# Patient Record
Sex: Female | Born: 1970 | Race: White | Hispanic: No | Marital: Single | Smoking: Former smoker
Health system: Southern US, Community
[De-identification: ages and names within clinical notes are randomized; demographics above are authoritative.]

## PROBLEM LIST (undated history)

## (undated) DIAGNOSIS — I10 Essential (primary) hypertension: Secondary | ICD-10-CM

## (undated) DIAGNOSIS — J45909 Unspecified asthma, uncomplicated: Secondary | ICD-10-CM

## (undated) HISTORY — PX: TUBAL LIGATION: SHX77

## (undated) HISTORY — PX: HERNIA REPAIR: SHX51

---

## 1999-06-04 ENCOUNTER — Emergency Department (HOSPITAL_COMMUNITY): Admission: EM | Admit: 1999-06-04 | Discharge: 1999-06-04 | Payer: Self-pay | Admitting: Emergency Medicine

## 1999-07-15 ENCOUNTER — Emergency Department (HOSPITAL_COMMUNITY): Admission: EM | Admit: 1999-07-15 | Discharge: 1999-07-15 | Payer: Self-pay | Admitting: Emergency Medicine

## 1999-12-02 ENCOUNTER — Emergency Department (HOSPITAL_COMMUNITY): Admission: EM | Admit: 1999-12-02 | Discharge: 1999-12-02 | Payer: Self-pay

## 2009-10-24 ENCOUNTER — Emergency Department (HOSPITAL_BASED_OUTPATIENT_CLINIC_OR_DEPARTMENT_OTHER): Admission: EM | Admit: 2009-10-24 | Discharge: 2009-10-24 | Payer: Self-pay | Admitting: Emergency Medicine

## 2009-10-28 ENCOUNTER — Emergency Department (HOSPITAL_BASED_OUTPATIENT_CLINIC_OR_DEPARTMENT_OTHER): Admission: EM | Admit: 2009-10-28 | Discharge: 2009-10-28 | Payer: Self-pay | Admitting: Emergency Medicine

## 2010-06-09 ENCOUNTER — Emergency Department (HOSPITAL_BASED_OUTPATIENT_CLINIC_OR_DEPARTMENT_OTHER): Admission: EM | Admit: 2010-06-09 | Discharge: 2010-06-09 | Payer: Self-pay | Admitting: Emergency Medicine

## 2010-12-18 LAB — WET PREP, GENITAL

## 2010-12-18 LAB — GC/CHLAMYDIA PROBE AMP, GENITAL: Chlamydia, DNA Probe: NEGATIVE

## 2010-12-18 LAB — URINE MICROSCOPIC-ADD ON

## 2010-12-18 LAB — URINE CULTURE

## 2010-12-18 LAB — PREGNANCY, URINE: Preg Test, Ur: NEGATIVE

## 2010-12-18 LAB — URINALYSIS, ROUTINE W REFLEX MICROSCOPIC
Bilirubin Urine: NEGATIVE
Ketones, ur: NEGATIVE mg/dL
Nitrite: NEGATIVE
pH: 6 (ref 5.0–8.0)

## 2016-04-25 ENCOUNTER — Encounter (HOSPITAL_BASED_OUTPATIENT_CLINIC_OR_DEPARTMENT_OTHER): Payer: Self-pay | Admitting: *Deleted

## 2016-04-25 ENCOUNTER — Emergency Department (HOSPITAL_BASED_OUTPATIENT_CLINIC_OR_DEPARTMENT_OTHER): Payer: Worker's Compensation

## 2016-04-25 ENCOUNTER — Emergency Department (HOSPITAL_BASED_OUTPATIENT_CLINIC_OR_DEPARTMENT_OTHER)
Admission: EM | Admit: 2016-04-25 | Discharge: 2016-04-25 | Disposition: A | Discharge status: Home / Self Care | Payer: Worker's Compensation | Attending: Emergency Medicine | Admitting: Emergency Medicine

## 2016-04-25 DIAGNOSIS — S6000XA Contusion of unspecified finger without damage to nail, initial encounter: Secondary | ICD-10-CM

## 2016-04-25 DIAGNOSIS — Y9389 Activity, other specified: Secondary | ICD-10-CM | POA: Diagnosis not present

## 2016-04-25 DIAGNOSIS — Y929 Unspecified place or not applicable: Secondary | ICD-10-CM | POA: Insufficient documentation

## 2016-04-25 DIAGNOSIS — Y990 Civilian activity done for income or pay: Secondary | ICD-10-CM | POA: Diagnosis not present

## 2016-04-25 DIAGNOSIS — Z79899 Other long term (current) drug therapy: Secondary | ICD-10-CM | POA: Diagnosis not present

## 2016-04-25 DIAGNOSIS — Z87891 Personal history of nicotine dependence: Secondary | ICD-10-CM | POA: Diagnosis not present

## 2016-04-25 DIAGNOSIS — W230XXA Caught, crushed, jammed, or pinched between moving objects, initial encounter: Secondary | ICD-10-CM | POA: Insufficient documentation

## 2016-04-25 DIAGNOSIS — S60041A Contusion of right ring finger without damage to nail, initial encounter: Secondary | ICD-10-CM | POA: Insufficient documentation

## 2016-04-25 DIAGNOSIS — I10 Essential (primary) hypertension: Secondary | ICD-10-CM | POA: Diagnosis not present

## 2016-04-25 DIAGNOSIS — S61214A Laceration without foreign body of right ring finger without damage to nail, initial encounter: Secondary | ICD-10-CM | POA: Diagnosis present

## 2016-04-25 DIAGNOSIS — J45909 Unspecified asthma, uncomplicated: Secondary | ICD-10-CM | POA: Diagnosis not present

## 2016-04-25 HISTORY — DX: Unspecified asthma, uncomplicated: J45.909

## 2016-04-25 HISTORY — DX: Essential (primary) hypertension: I10

## 2016-04-25 NOTE — ED Notes (Addendum)
Per pt report ring on rt ring finger got caught in cart and pulled upwards. Bruising and swelling present to ring finger.  Work related injury, pt asked if needed a drug screen , pt reports no.

## 2016-04-25 NOTE — Discharge Instructions (Signed)
I recommend taking 600 mg ibuprofen every 6 hours for pain relief. I also recommend resting, elevating and applying ice to affected area for 15 minutes to help with pain and swelling. Please follow up with a primary care provider from the Resource Guide provided below in one week if your symptoms have not resolved. Please return to the Emergency Department if symptoms worsen or new onset of fever, redness, swelling, warmth, numbness, tingling, weakness.

## 2016-04-25 NOTE — ED Provider Notes (Signed)
CSN: [id number]     Arrival date & time 04/25/16  1058 History   First MD Initiated Contact with Patient 04/25/16 1114     Chief Complaint  Patient presents with  . Finger Injury     (Consider location/radiation/quality/duration/timing/severity/associated sxs/prior Treatment) HPI   Patient is a 45 year old female past medical history of hypertension who presents to the ED with complaint of right ring finger pain, onset a 30 this morning. Patient reports while she was at work pushing a cart, the door in front of her swung closed and hit her cart. She notes she jerked her hand up to try to prevent a container on the cart from falling but notes her ring on her right ring finger got caught on the cart rail. Patient reports taking her ring off immediately and then endorses having swelling and bruising to her right ring finger. Patient endorses pain to her right ring finger extending into her right hand. Pain worse with movement. Denies numbness, tingling, weakness. Patient endorses taking ibuprofen earlier this morning prior to injury. Patient reports she has applied ice to her finger with mild relief. Denies any other pain or complaints.  Past Medical History  Diagnosis Date  . Asthma   . Hypertension    Past Surgical History  Procedure Laterality Date  . Hernia repair    . Tubal ligation     History reviewed. No pertinent family history. Social History  Substance Use Topics  . Smoking status: Former Smoker    Quit date: 04/06/2012  . Smokeless tobacco: None  . Alcohol Use: Yes     Comment: occ   OB History    No data available     Review of Systems  Musculoskeletal: Positive for joint swelling and arthralgias (right ring finger).  Skin: Negative for wound.  Neurological: Negative for weakness and numbness.      Allergies  Penicillins  Home Medications   Prior to Admission medications   Medication Sig Start Date End Date Taking? Authorizing Provider  albuterol  (PROVENTIL HFA;VENTOLIN HFA) 108 (90 Base) MCG/ACT inhaler Inhale into the lungs every 6 (six) hours as needed for wheezing or shortness of breath.   Yes Historical Provider, MD  allopurinol (ZYLOPRIM) 100 MG tablet Take 100 mg by mouth daily.   Yes Historical Provider, MD  fluticasone-salmeterol (ADVAIR HFA) 230-21 MCG/ACT inhaler Inhale 2 puffs into the lungs 2 (two) times daily.   Yes Historical Provider, MD  lisinopril-hydrochlorothiazide (PRINZIDE,ZESTORETIC) 20-12.5 MG tablet Take 1 tablet by mouth daily.   Yes Historical Provider, MD   BP 111/69 mmHg  Pulse 83  Temp(Src) 98.7 F (37.1 C) (Oral)  Resp 18  Ht  (1.676 m)  Wt 158.759 kg  BMI 56.52 kg/m2  SpO2 100%  LMP 01/25/2016 (Approximate) Physical Exam  Constitutional: She is oriented to person, place, and time. She appears well-developed and well-nourished.  HENT:  Head: Normocephalic and atraumatic.  Eyes: Conjunctivae and EOM are normal. Right eye exhibits no discharge. Left eye exhibits no discharge. No scleral icterus.  Neck: Normal range of motion. Neck supple.  Cardiovascular: Normal rate.   Pulmonary/Chest: Effort normal. No respiratory distress.  Musculoskeletal: Normal range of motion. She exhibits tenderness.       Right hand: She exhibits tenderness and swelling. She exhibits normal range of motion, normal capillary refill, no deformity and no laceration. Normal sensation noted. Normal strength noted.       Hands: Mild swelling and ecchymoses noted to right 4th proximal phalanx. Mild  TTP of right 4th metacarpal, MCP joint, proximal phalanx and PIP joint. FROM of right wrist, forearm and elbow. Equal grip strength bilaterally. Sensation grossly intact. 2+ radial pulse. Cap refill <2.   Neurological: She is alert and oriented to person, place, and time.  Nursing note and vitals reviewed.   ED Course  Procedures (including critical care time) Labs Review Labs Reviewed - No data to display  Imaging Review Dg  Hand Complete Right  04/25/2016  CLINICAL DATA:  Pain after trauma EXAM: RIGHT HAND - COMPLETE 3+ VIEW COMPARISON:  None. FINDINGS: There is soft tissue swelling at a with no underlying fracture. IMPRESSION: Soft tissue swelling in the region of patient's symptoms with no underlying fracture. Electronically Signed   By: Gerome Sam III M.D   On: 04/25/2016 12:01   I have personally reviewed and evaluated these images and lab results as part of my medical decision-making.   EKG Interpretation None      MDM   Final diagnoses:  Finger contusion, initial encounter    Patient presents with right ring finger pain and swelling after having her ring caught on a cart at work resulting in her finger getting pulled upwards. Patient removed ring prior to arrival. Denies any other associated symptoms. VSS. Exam revealed mild swelling And ecchymoses of right ring finger. Right hand and fingers neurovascularly intact. Right hand revealed soft tissue swelling without fracture. Discussed results and plan for discharge with patient. Plan to discharge patient home with NSAIDs and symptomatically treatment. Advised patient to follow up with PCP as needed. Discussed return precautions with patient.    Satira Sark [place], [place] 04/25/16 1658   Nelva Nay, MD 04/26/16 [phone]

## 2020-10-10 ENCOUNTER — Encounter (HOSPITAL_BASED_OUTPATIENT_CLINIC_OR_DEPARTMENT_OTHER): Payer: Self-pay | Admitting: *Deleted

## 2020-10-10 ENCOUNTER — Other Ambulatory Visit: Payer: Self-pay

## 2020-10-10 ENCOUNTER — Emergency Department (HOSPITAL_BASED_OUTPATIENT_CLINIC_OR_DEPARTMENT_OTHER)
Admission: EM | Admit: 2020-10-10 | Discharge: 2020-10-10 | Disposition: A | Discharge status: Home / Self Care | Payer: 59 | Attending: Emergency Medicine | Admitting: Emergency Medicine

## 2020-10-10 ENCOUNTER — Emergency Department (HOSPITAL_BASED_OUTPATIENT_CLINIC_OR_DEPARTMENT_OTHER): Payer: 59

## 2020-10-10 DIAGNOSIS — S0003XA Contusion of scalp, initial encounter: Secondary | ICD-10-CM | POA: Diagnosis not present

## 2020-10-10 DIAGNOSIS — Z7901 Long term (current) use of anticoagulants: Secondary | ICD-10-CM | POA: Diagnosis not present

## 2020-10-10 DIAGNOSIS — J45909 Unspecified asthma, uncomplicated: Secondary | ICD-10-CM | POA: Diagnosis not present

## 2020-10-10 DIAGNOSIS — Z86718 Personal history of other venous thrombosis and embolism: Secondary | ICD-10-CM | POA: Insufficient documentation

## 2020-10-10 DIAGNOSIS — Z87891 Personal history of nicotine dependence: Secondary | ICD-10-CM | POA: Insufficient documentation

## 2020-10-10 DIAGNOSIS — S1093XA Contusion of unspecified part of neck, initial encounter: Secondary | ICD-10-CM | POA: Diagnosis not present

## 2020-10-10 DIAGNOSIS — S0990XA Unspecified injury of head, initial encounter: Secondary | ICD-10-CM | POA: Diagnosis present

## 2020-10-10 DIAGNOSIS — Z86711 Personal history of pulmonary embolism: Secondary | ICD-10-CM | POA: Insufficient documentation

## 2020-10-10 DIAGNOSIS — W108XXA Fall (on) (from) other stairs and steps, initial encounter: Secondary | ICD-10-CM | POA: Diagnosis not present

## 2020-10-10 DIAGNOSIS — Z7951 Long term (current) use of inhaled steroids: Secondary | ICD-10-CM | POA: Diagnosis not present

## 2020-10-10 DIAGNOSIS — T148XXA Other injury of unspecified body region, initial encounter: Secondary | ICD-10-CM

## 2020-10-10 DIAGNOSIS — Z79899 Other long term (current) drug therapy: Secondary | ICD-10-CM | POA: Insufficient documentation

## 2020-10-10 DIAGNOSIS — I10 Essential (primary) hypertension: Secondary | ICD-10-CM | POA: Insufficient documentation

## 2020-10-10 DIAGNOSIS — W19XXXA Unspecified fall, initial encounter: Secondary | ICD-10-CM

## 2020-10-10 LAB — CBC WITH DIFFERENTIAL/PLATELET
Abs Immature Granulocytes: 0.04 10*3/uL (ref 0.00–0.07)
Basophils Absolute: 0 10*3/uL (ref 0.0–0.1)
Basophils Relative: 1 %
Eosinophils Absolute: 0.1 10*3/uL (ref 0.0–0.5)
Eosinophils Relative: 2 %
HCT: 32.8 % — ABNORMAL LOW (ref 36.0–46.0)
Hemoglobin: 11.3 g/dL — ABNORMAL LOW (ref 12.0–15.0)
Immature Granulocytes: 1 %
Lymphocytes Relative: 28 %
Lymphs Abs: 2.3 10*3/uL (ref 0.7–4.0)
MCH: 37 pg — ABNORMAL HIGH (ref 26.0–34.0)
MCHC: 34.5 g/dL (ref 30.0–36.0)
MCV: 107.5 fL — ABNORMAL HIGH (ref 80.0–100.0)
Monocytes Absolute: 0.7 10*3/uL (ref 0.1–1.0)
Monocytes Relative: 9 %
Neutro Abs: 4.9 10*3/uL (ref 1.7–7.7)
Neutrophils Relative %: 59 %
Platelets: 209 10*3/uL (ref 150–400)
RBC: 3.05 MIL/uL — ABNORMAL LOW (ref 3.87–5.11)
RDW: 13.2 % (ref 11.5–15.5)
WBC: 8.2 10*3/uL (ref 4.0–10.5)
nRBC: 0 % (ref 0.0–0.2)

## 2020-10-10 LAB — LACTIC ACID, PLASMA
Lactic Acid, Venous: 2.2 mmol/L (ref 0.5–1.9)
Lactic Acid, Venous: 1.3 mmol/L (ref 0.5–1.9)

## 2020-10-10 LAB — COMPREHENSIVE METABOLIC PANEL
ALT: 25 U/L (ref 0–44)
AST: 30 U/L (ref 15–41)
Albumin: 3.5 g/dL (ref 3.5–5.0)
Alkaline Phosphatase: 45 U/L (ref 38–126)
Anion gap: 12 (ref 5–15)
BUN: 11 mg/dL (ref 6–20)
CO2: 24 mmol/L (ref 22–32)
Calcium: 8.9 mg/dL (ref 8.9–10.3)
Chloride: 100 mmol/L (ref 98–111)
Creatinine, Ser: 0.79 mg/dL (ref 0.44–1.00)
GFR, Estimated: >60 mL/min (ref >60)
Glucose, Bld: 102 mg/dL — ABNORMAL HIGH (ref 70–99)
Potassium: 2.9 mmol/L — ABNORMAL LOW (ref 3.5–5.1)
Sodium: 136 mmol/L (ref 135–145)
Total Bilirubin: 1.2 mg/dL (ref 0.3–1.2)
Total Protein: 6.9 g/dL (ref 6.5–8.1)

## 2020-10-10 LAB — TROPONIN I (HIGH SENSITIVITY)
Troponin I (High Sensitivity): <2 ng/L (ref <18)
Troponin I (High Sensitivity): 2 ng/L (ref <18)

## 2020-10-10 LAB — CK: Total CK: 263 U/L — ABNORMAL HIGH (ref 38–234)

## 2020-10-10 LAB — PROTIME-INR
INR: 2.2 — ABNORMAL HIGH (ref 0.8–1.2)
Prothrombin Time: 23.6 seconds — ABNORMAL HIGH (ref 11.4–15.2)

## 2020-10-10 MED ORDER — POTASSIUM CHLORIDE CRYS ER 20 MEQ PO TBCR
40.0000 meq | EXTENDED_RELEASE_TABLET | Freq: Once | ORAL | Status: AC
  Administered 2020-10-10: 40 meq via ORAL
  Filled 2020-10-10: qty 2

## 2020-10-10 MED ORDER — LACTATED RINGERS IV BOLUS
1000.0000 mL | Freq: Once | INTRAVENOUS | Status: AC
  Administered 2020-10-10: 1000 mL via INTRAVENOUS

## 2020-10-10 NOTE — ED Provider Notes (Signed)
[facility] EMERGENCY DEPARTMENT Provider Note   CSN: [id number] Arrival date & time: 10/10/20  1503     History Chief Complaint  Patient presents with  . Fall    Adriana Hernandez is a 50 y.o. female.  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  She fell down 10 basement stairs 4 days ago.  She slipped at the top of the stairs carrying laundry basket.  Patient reports at the bottom of the stairs she is not sure if she lost consciousness temporarily.  She found herself upside down at the base of the stairs with everything spread out around her.  She was able to maneuver herself and ultimately get back up again.  She was housesitting by herself at her daughter's home.  She went to bed.  She reports the next day she felt very fatigued and went home.  She reports she mostly slept for the following 2 days.  She reports she is not had any vomiting.  No headache.  No visual changes.  She reports however she has felt extremely fatigued and she also feels lightheaded when she stands up.  Patient reports that she went to work today and she has a very physically simple job.  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  However, today was standing she started to feel very weak and like she might pass out.  She reports she became very concerned that maybe she had developed an intracranial bleed and presents emergency department for evaluation.  She reports she has continued to take her Coumadin for the past several days.  Patient reports she has extensive bruising over all of her body.  She reports some of the bruised areas were very firm and warm as of last night.    Past Medical History:  Diagnosis Date  . Asthma   . Hypertension     There are no problems to display for this patient.   Past Surgical History:  Procedure Laterality Date  . HERNIA REPAIR    . TUBAL LIGATION       OB History   No obstetric history on  file.     No family history on file.  Social History   Tobacco Use  . Smoking status: Former Smoker    Quit date: 04/06/2012    Years since quitting: 8.5  . Smokeless tobacco: Never Used  Substance Use Topics  . Alcohol use: Yes    Comment: occ  . Drug use: No    Home Medications Prior to Admission medications   Medication Sig Start Date End Date Taking? Authorizing Provider  albuterol (PROVENTIL HFA;VENTOLIN HFA) 108 (90 Base) MCG/ACT inhaler Inhale into the lungs every 6 (six) hours as needed for wheezing or shortness of breath.   Yes [provider]  atorvastatin (LIPITOR) 20 MG tablet TAKE 1 TABLET BY MOUTH EVERY DAY AT NIGHT 06/28/20  Yes [provider]  colchicine 0.6 MG tablet Take 1 tablet by mouth daily. 09/23/20  Yes [provider]  cyanocobalamin (,VITAMIN B-12,) 1000 MCG/ML injection Inject into the muscle. 02/16/20  Yes [provider]  DULoxetine (CYMBALTA) 30 MG capsule Take by mouth. 09/03/20 12/02/20 Yes [provider]  ergocalciferol (VITAMIN D2) 1.25 MG (50000 UT) capsule TAKE 1 CAPSULE BY MOUTH ONE TIME PER WEEK 04/28/17  Yes [provider]  lisinopril-hydrochlorothiazide (PRINZIDE,ZESTORETIC) 20-12.5 MG tablet Take 1 tablet by mouth daily.   Yes  [provider]  warfarin (COUMADIN) 5 MG tablet TAKE 1 TABLET DAILY AT 6PM. EXCEPT 1 AND 1/2 TABLET ON  THURSDAYS -- OR AS DIRECTED 10/07/20  Yes [provider]  zolpidem (AMBIEN) 5 MG tablet TAKE 1 TABLET BY MOUTH EVERY DAY AT BEDTIME AS NEEDED FOR SLEEP 09/23/20  Yes [provider]  allopurinol (ZYLOPRIM) 100 MG tablet Take 100 mg by mouth daily.    [provider]  fluticasone-salmeterol (ADVAIR HFA) 230-21 MCG/ACT inhaler Inhale 2 puffs into the lungs 2 (two) times daily.    [provider]    Allergies    Penicillins  Review of Systems   Review of Systems 10 systems reviewed and negative except as per  HPI Physical Exam Updated Vital Signs BP 103/69   Pulse 100   Temp 98.1 F (36.7 C) (Oral)   Resp 20   Ht 5\' 6"  (1.676 m)   Wt 119.3 kg   SpO2 100%   BMI 42.45 kg/m   Physical Exam Constitutional:      Comments: Patient is sitting at the chair at bedside.  No acute distress.  Mental status clear.  No respiratory distress.  Pale in appearance.  HENT:     Head:     Comments: Boggy scalp hematoma on the right.  No facial hematomas.  No periorbital hematoma.    Nose: Nose normal.     Mouth/Throat:     Mouth: Mucous membranes are moist.     Pharynx: Oropharynx is clear.  Eyes:     Pupils: Pupils are equal, round, and reactive to light.     Comments: Baseline disconjugate gaze.  Cardiovascular:     Rate and Rhythm: Normal rate and regular rhythm.     Heart sounds: Normal heart sounds.  Pulmonary:     Effort: Pulmonary effort is normal.     Breath sounds: Normal breath sounds.  Abdominal:     General: There is no distension.     Palpations: Abdomen is soft.     Tenderness: There is no abdominal tenderness. There is no guarding.  Musculoskeletal:     Cervical back: Neck supple.     Comments: Extensive hematomas and ecchymosis.  See attached images.  Normal range of motion.  Patient can stand ambulate and move all extremities at baseline.  Skin:    General: Skin is warm and dry.     Coloration: Skin is pale.  Neurological:     General: No focal deficit present.     Mental Status: She is oriented to person, place, and time.     Coordination: Coordination normal.  Psychiatric:        Mood and Affect: Mood normal.           ED Results / Procedures / Treatments   Labs (all labs ordered are listed, but only abnormal results are displayed) Labs Reviewed  COMPREHENSIVE METABOLIC PANEL  CBC WITH DIFFERENTIAL/PLATELET  PROTIME-INR  CK  LACTIC ACID, PLASMA  LACTIC ACID, PLASMA  TROPONIN I (HIGH SENSITIVITY)    EKG None  Radiology CT Head Wo Contrast  Result  Date: 10/10/2020 CLINICAL DATA:  Status post fall 4 days ago. EXAM: CT HEAD WITHOUT CONTRAST TECHNIQUE: Contiguous axial images were obtained from the base of the skull through the vertex without intravenous contrast. COMPARISON:  None. FINDINGS: Brain: No evidence of acute infarction, hemorrhage, hydrocephalus, extra-axial collection or mass lesion/mass effect. Vascular: No hyperdense vessel or unexpected calcification. Skull: Normal. Negative for fracture or focal lesion.  Sinuses/Orbits: No acute finding. Other: Mild right frontoparietal scalp soft tissue swelling is seen. IMPRESSION: Mild right frontoparietal scalp soft tissue swelling, without evidence of an underlying fracture or acute intracranial process. Electronically Signed   By: Aram Candela M.D.   On: 10/10/2020 16:33    Procedures Procedures (including critical care time)  Medications Ordered in ED Medications - No data to display  ED Course  I have reviewed the triage vital signs and the nursing notes.  Pertinent labs & imaging results that were available during my care of the patient were reviewed by me and considered in my medical decision making (see chart for details).    MDM Rules/Calculators/A&P                          Larey Seat down multiple stairs 4 days prior to presentation.  Patient continues to feel dizzy and very fatigued.  Patient is anticoagulated.  She is therapeutic on Coumadin.  Patient does not have significant anemia or evidence of rhabdomyolysis or hyperbilirubinemia despite massive bruising over her whole body.  Patient is not febrile.  She is mildly orthostatic.  Patient rehydrated in the emergency department.  She is nontoxic normal neurologic exam.  She is not having ongoing headache but does have persistent dizziness with standing which sounds more orthostatic After hydration blood pressure is normotensive.  At this time, stable for discharge.  Patient is 4 days post injury without evidence of significant  complications other than orthostasis and extensive bruising. Return precautions reviewed.  Encouraged for close follow-up with PCP with several days of rest and hydration. Final Clinical Impression(s) / ED Diagnoses Final diagnoses:  Fall, initial encounter  Injury of head, initial encounter  Hematoma and contusion  Anticoagulated    Rx / DC Orders ED Discharge Orders    None       Arby Barrette, MD 10/11/20 2126

## 2020-10-10 NOTE — ED Triage Notes (Addendum)
4 days ago she slid off a step and fell down 10 steps. Injury to her head and body. She slept for 2 days without seeking tx. She drove herself here from work today. She has multiple bruises to her arms and arms. She takes Coumadin. She is ambulatory. Alert and oriented.

## 2020-10-10 NOTE — Discharge Instructions (Signed)
1.  You have extensive hematomas. 2.  You appear to have become dehydrated. 3.  Hydrate and rest for the next 2 days.  Try to schedule a recheck with your doctor. 4.  If you feel dizzy or lightheaded sit back down.  If your symptoms are worsening or advancing, return to the emergency department immediately.

## 2022-03-14 IMAGING — CR DG FOOT COMPLETE 3+V*R*
3 series · 3 of 3 positions shown · non-contrast
Comparison: None.

CLINICAL DATA: Status post fall.

EXAM:
RIGHT FOOT COMPLETE - 3+ VIEW

[t foot ap right]
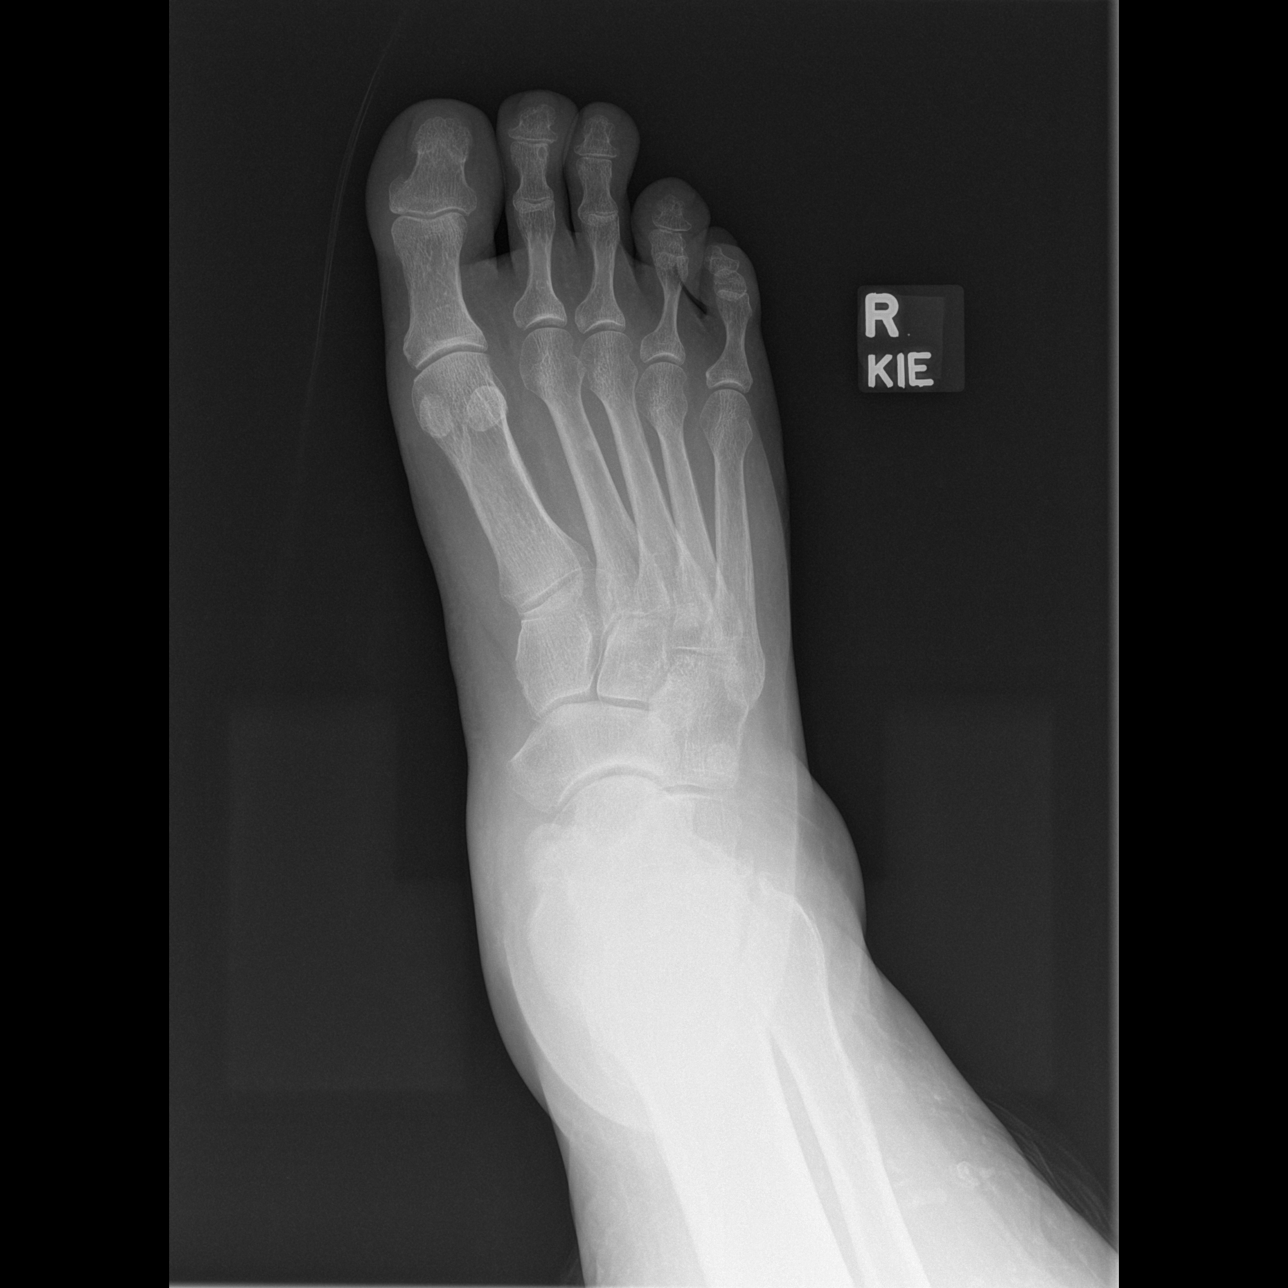

[t foot oblique right]
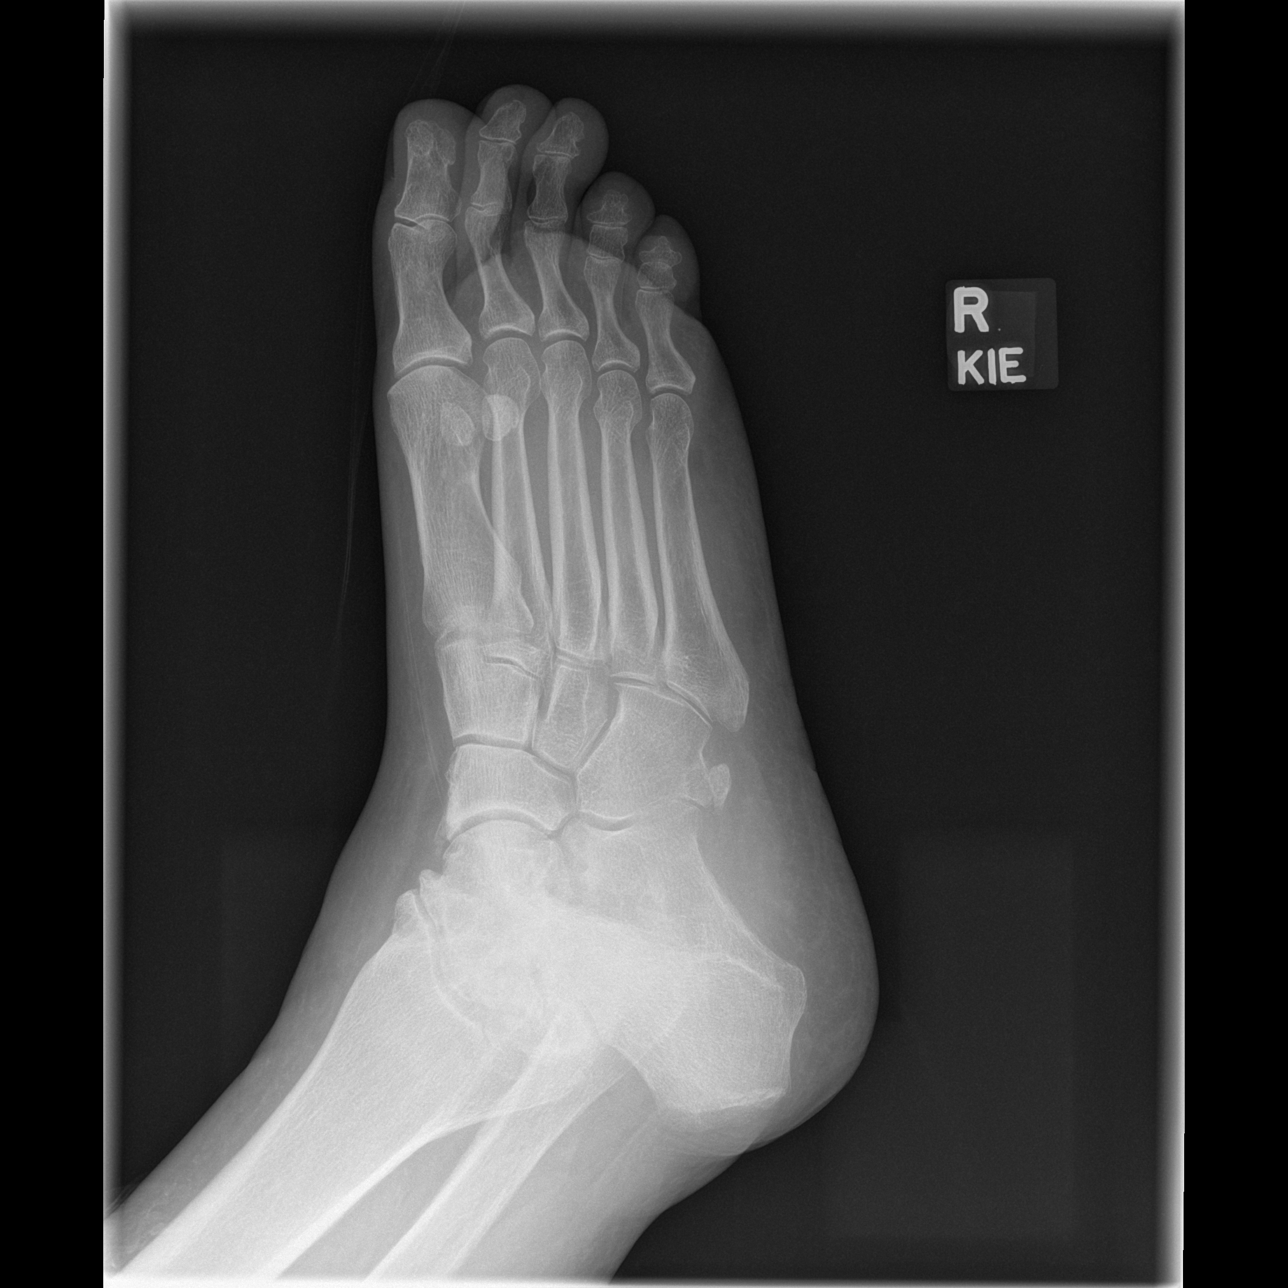

[t foot lat right]
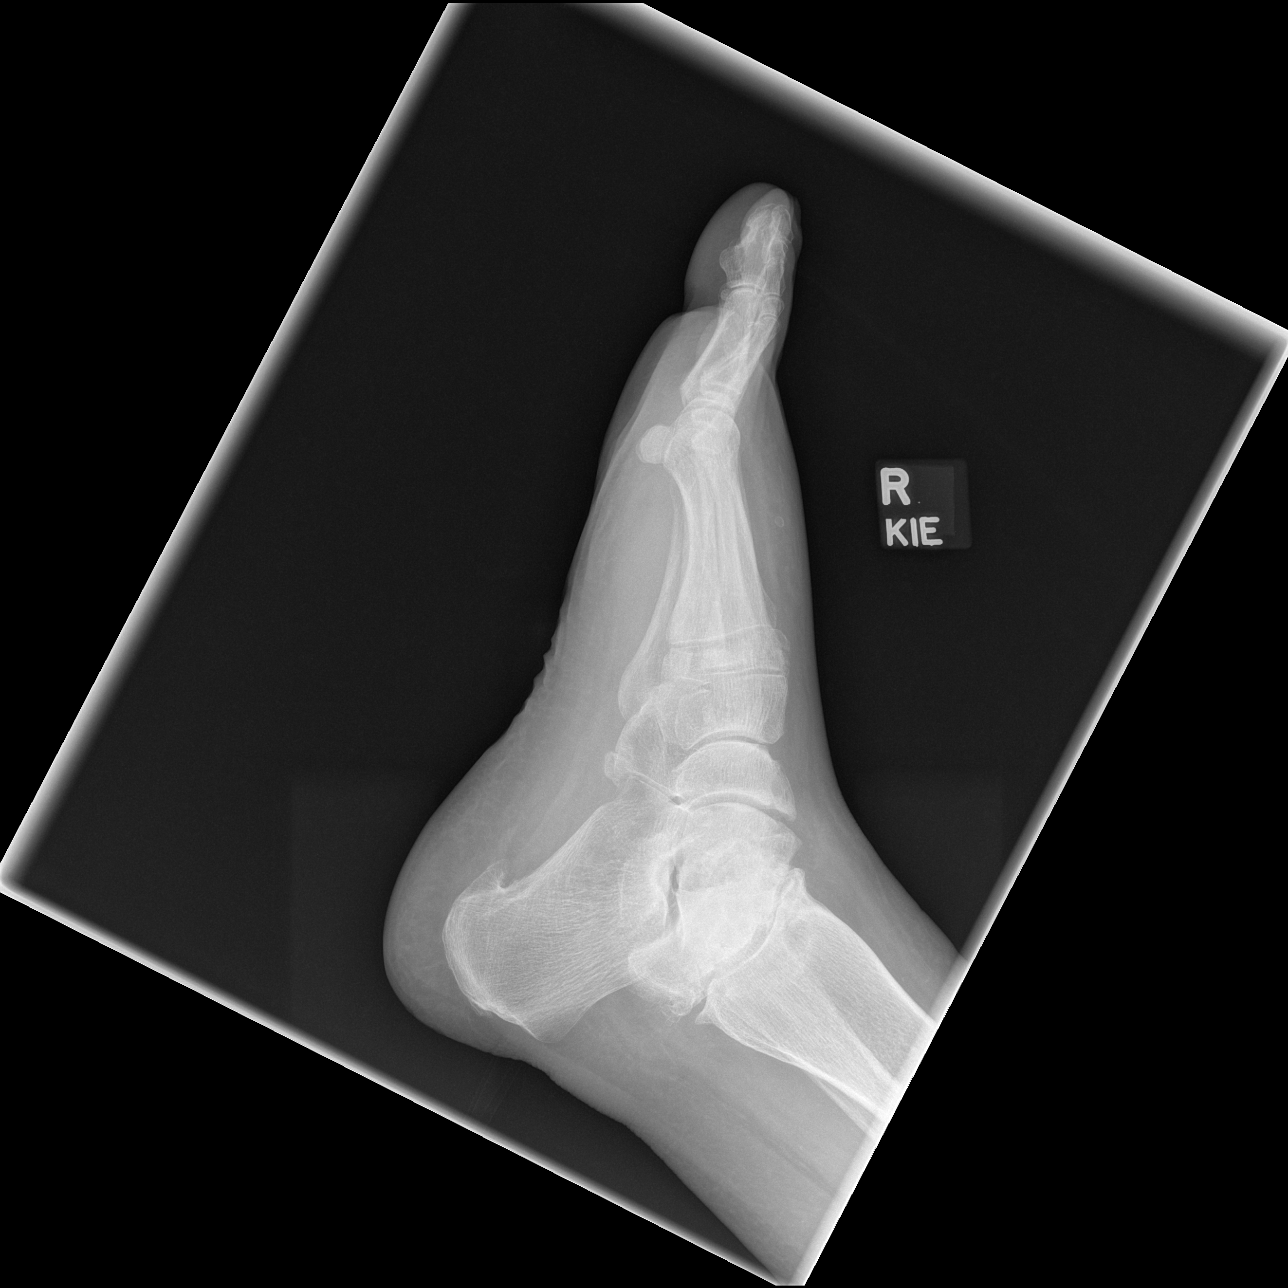

[3 of 3 positions shown; findings below may reference images not displayed]

FINDINGS: There is no evidence of acute fracture or dislocation. A chronic
appearing deformity is seen involving the right talus. Chronic
changes are also seen along the adjacent portion of the distal right
tibia. A large plantar calcaneal spur is seen. Mild soft tissue
swelling is seen along the dorsal aspect of the right foot.
IMPRESSION: 1. No acute fracture or dislocation.
2. Chronic and degenerative changes, as described above.

## 2022-03-14 IMAGING — CT CT HEAD W/O CM
3 series · 16 of 47 positions shown, 19 images · non-contrast
Comparison: None.

CLINICAL DATA: Status post fall 4 days ago.

EXAM:
CT HEAD WITHOUT CONTRAST
TECHNIQUE: Contiguous axial images were obtained from the base of the skull
through the vertex without intravenous contrast.

[Series 2: head 5.0 h30s · axial · 0.46mm/px · z∈[-178,-48]mm · 10 of 32 slices shown, 13 images]
[im 3/32  brain]
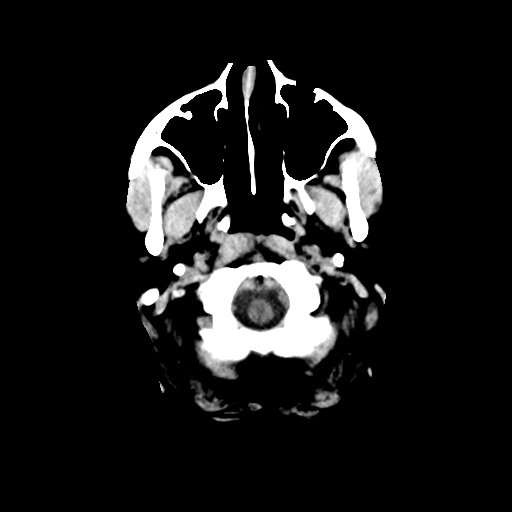
[im 3/32  bone]
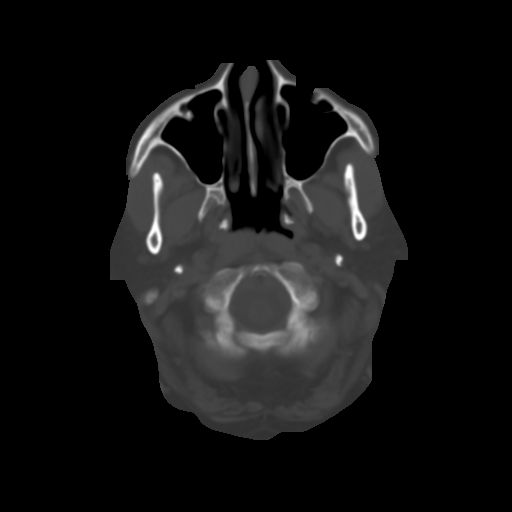
[im 6/32  brain]
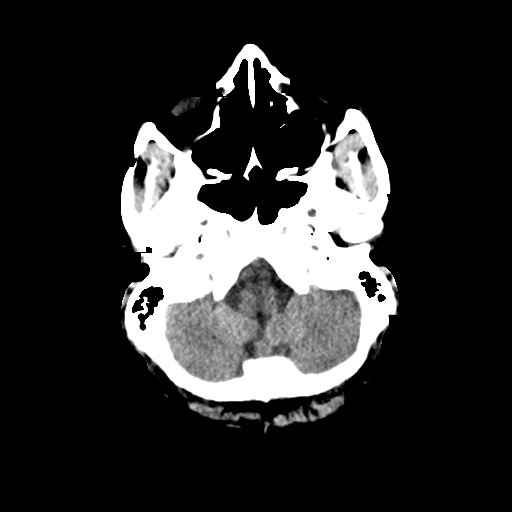
[im 9/32  brain]
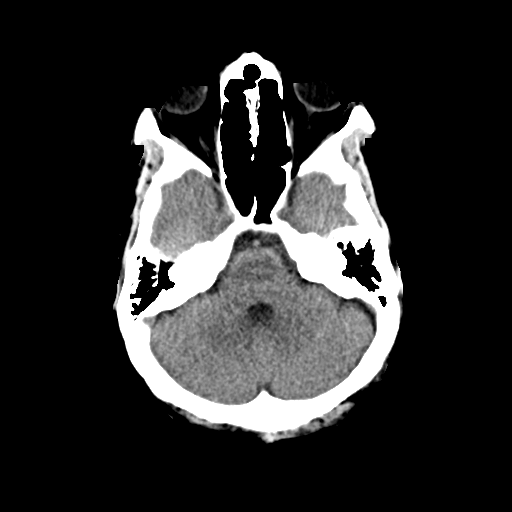
[im 11/32  brain]
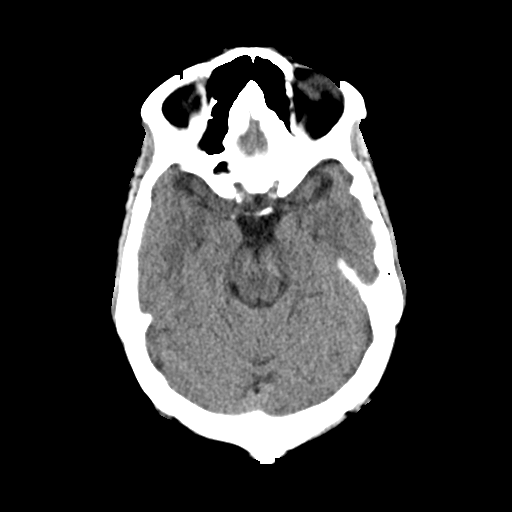
[im 14/32  brain]
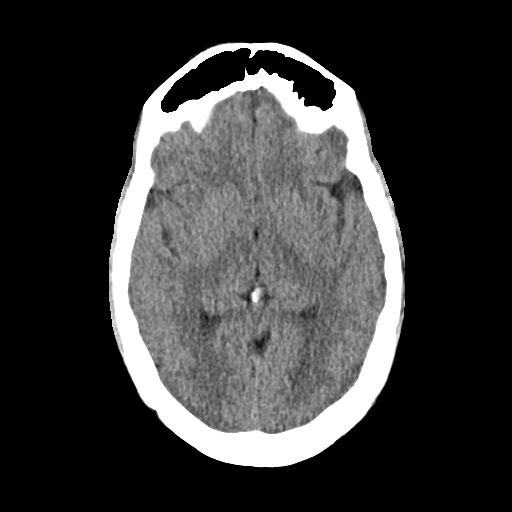
[im 14/32  bone]
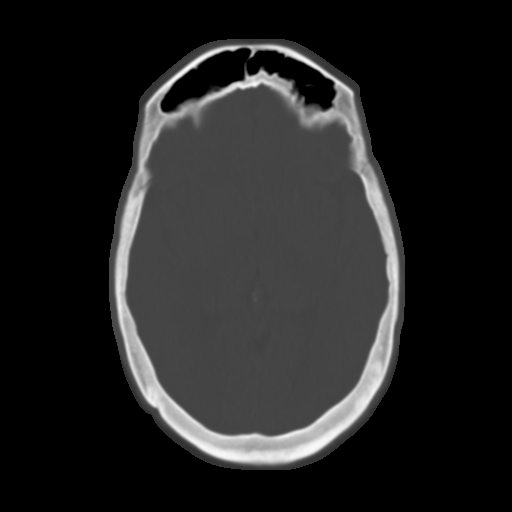
[im 18/32  brain]
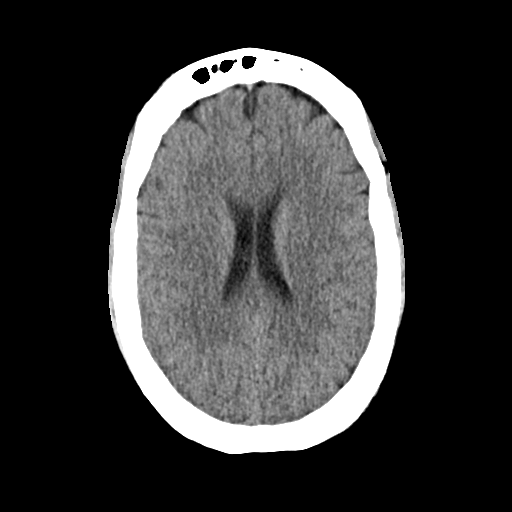
[im 21/32  brain]
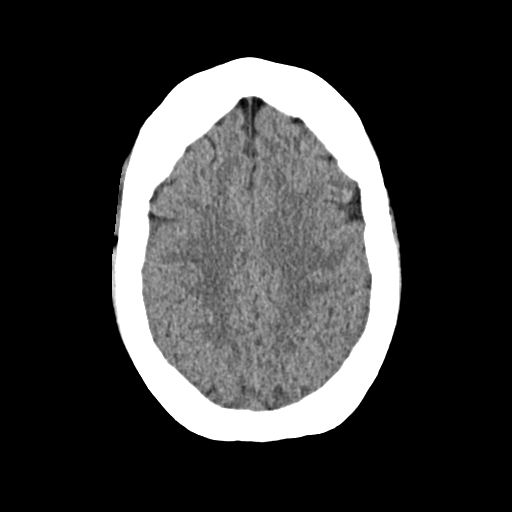
[im 24/32  brain]
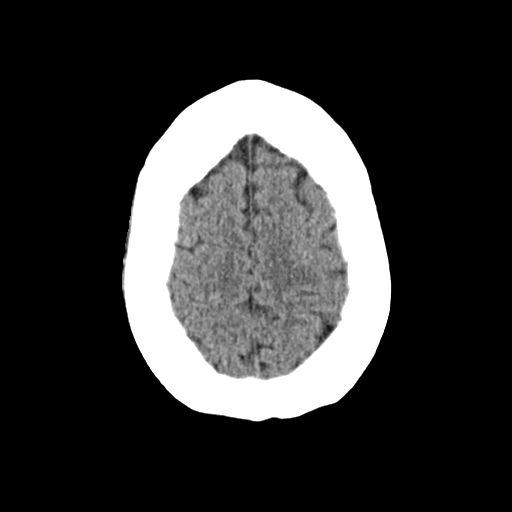
[im 26/32  brain]
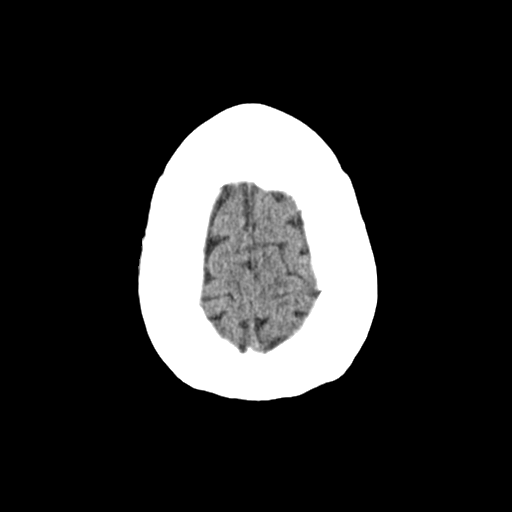
[im 26/32  bone]
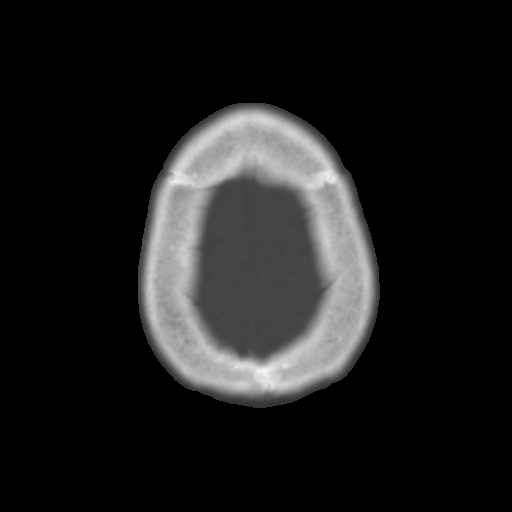
[im 29/32  brain]
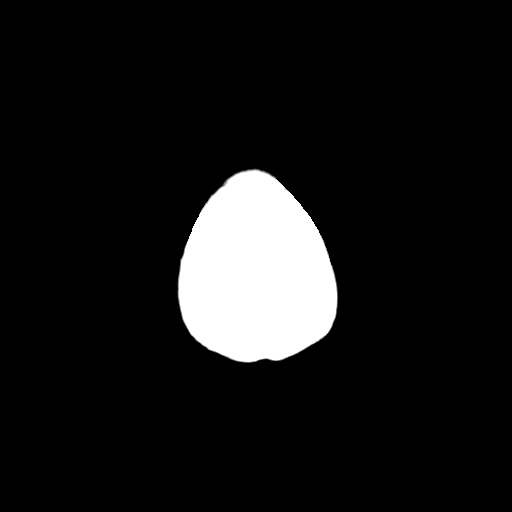

[Series 4: head 3.0 mpr cor · coronal · 0.32mm/px · 3 of 73 slices shown]
[im 25/73  brain]
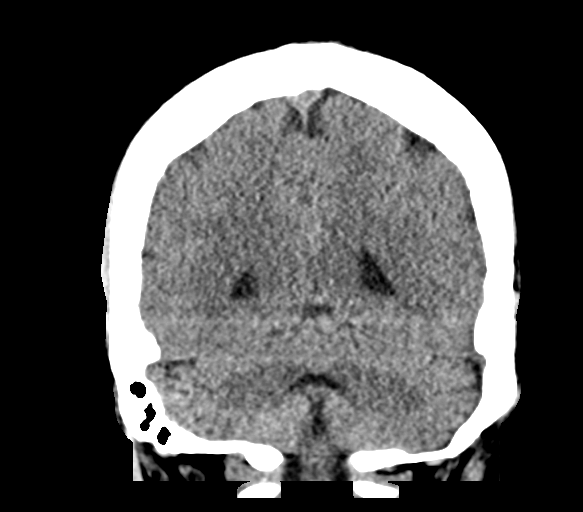
[im 33/73  brain]
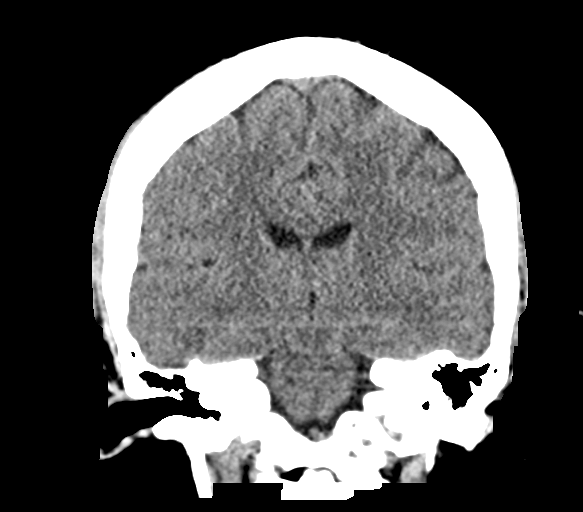
[im 41/73  brain]
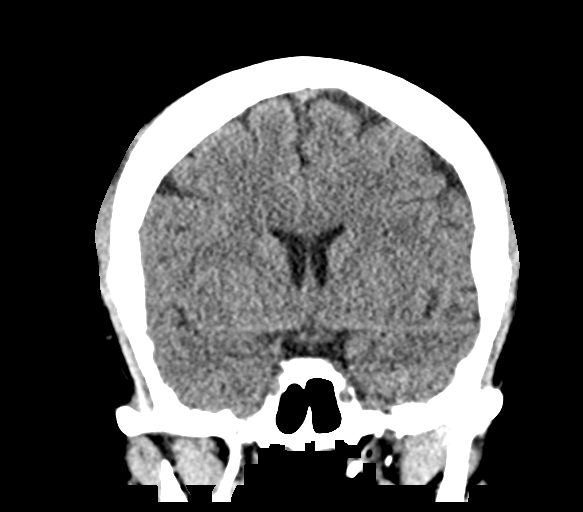

[Series 5: head 3.0 mpr sag · sagittal · 0.32mm/px · 3 of 54 slices shown]
[im 18/54  brain]
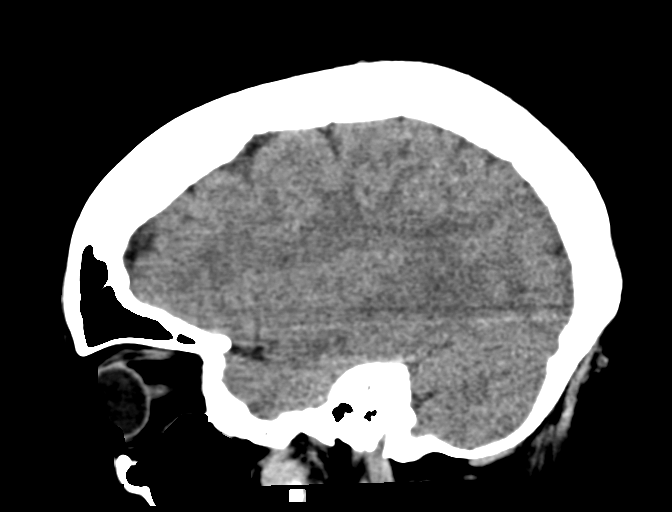
[im 27/54  brain]
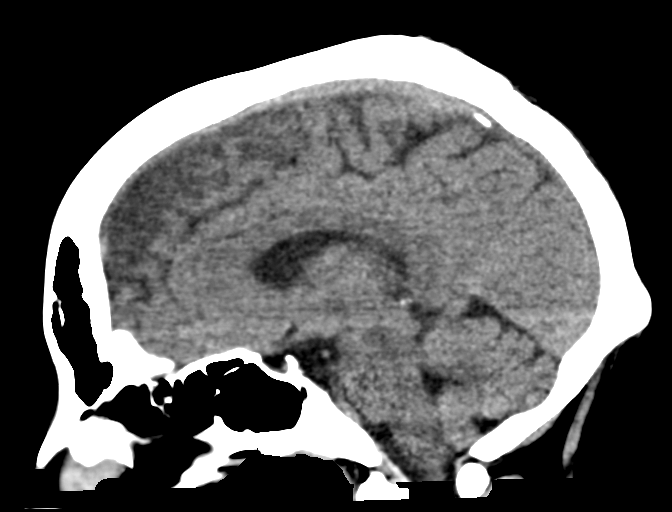
[im 36/54  brain]
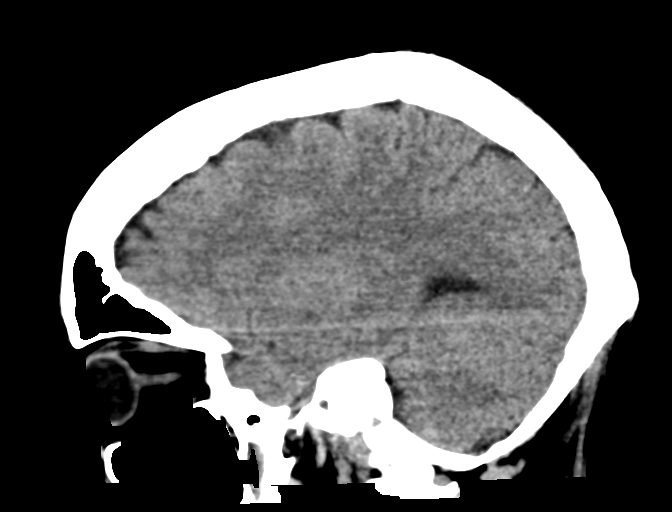

[16 of 47 positions shown; findings below may reference images not displayed]

FINDINGS: Brain: No evidence of acute infarction, hemorrhage, hydrocephalus,
extra-axial collection or mass lesion/mass effect.

Vascular: No hyperdense vessel or unexpected calcification.

Skull: Normal. Negative for fracture or focal lesion.

Sinuses/Orbits: No acute finding.

Other: Mild right frontoparietal scalp soft tissue swelling is seen.
IMPRESSION: Mild right frontoparietal scalp soft tissue swelling, without
evidence of an underlying fracture or acute intracranial process.

## 2023-06-30 ENCOUNTER — Emergency Department (HOSPITAL_BASED_OUTPATIENT_CLINIC_OR_DEPARTMENT_OTHER): Payer: 59

## 2023-06-30 ENCOUNTER — Encounter (HOSPITAL_BASED_OUTPATIENT_CLINIC_OR_DEPARTMENT_OTHER): Payer: Self-pay | Admitting: Pediatrics

## 2023-06-30 ENCOUNTER — Emergency Department (HOSPITAL_BASED_OUTPATIENT_CLINIC_OR_DEPARTMENT_OTHER)
Admission: EM | Admit: 2023-06-30 | Discharge: 2023-06-30 | Disposition: A | Discharge status: Home / Self Care | Payer: 59 | Attending: Emergency Medicine | Admitting: Emergency Medicine

## 2023-06-30 ENCOUNTER — Other Ambulatory Visit: Payer: Self-pay

## 2023-06-30 DIAGNOSIS — R55 Syncope and collapse: Secondary | ICD-10-CM | POA: Diagnosis not present

## 2023-06-30 DIAGNOSIS — R42 Dizziness and giddiness: Secondary | ICD-10-CM | POA: Diagnosis present

## 2023-06-30 DIAGNOSIS — Z7901 Long term (current) use of anticoagulants: Secondary | ICD-10-CM | POA: Insufficient documentation

## 2023-06-30 LAB — PROTIME-INR
INR: 1.1 (ref 0.8–1.2)
Prothrombin Time: 14.5 seconds (ref 11.4–15.2)

## 2023-06-30 LAB — BASIC METABOLIC PANEL
Anion gap: 10 (ref 5–15)
BUN: 12 mg/dL (ref 6–20)
CO2: 22 mmol/L (ref 22–32)
Calcium: 8.7 mg/dL — ABNORMAL LOW (ref 8.9–10.3)
Chloride: 102 mmol/L (ref 98–111)
Creatinine, Ser: 0.72 mg/dL (ref 0.44–1.00)
GFR, Estimated: >60 mL/min (ref >60)
Glucose, Bld: 93 mg/dL (ref 70–99)
Potassium: 4.1 mmol/L (ref 3.5–5.1)
Sodium: 134 mmol/L — ABNORMAL LOW (ref 135–145)

## 2023-06-30 LAB — CBC
HCT: 37.5 % (ref 36.0–46.0)
Hemoglobin: 13.4 g/dL (ref 12.0–15.0)
MCH: 39.5 pg — ABNORMAL HIGH (ref 26.0–34.0)
MCHC: 35.7 g/dL (ref 30.0–36.0)
MCV: 110.6 fL — ABNORMAL HIGH (ref 80.0–100.0)
Platelets: 176 10*3/uL (ref 150–400)
RBC: 3.39 MIL/uL — ABNORMAL LOW (ref 3.87–5.11)
RDW: 12.9 % (ref 11.5–15.5)
WBC: 6.7 10*3/uL (ref 4.0–10.5)
nRBC: 0 % (ref 0.0–0.2)

## 2023-06-30 LAB — TROPONIN I (HIGH SENSITIVITY): Troponin I (High Sensitivity): 2 ng/L (ref <18)

## 2023-06-30 MED ORDER — IOHEXOL 350 MG/ML SOLN
100.0000 mL | Freq: Once | INTRAVENOUS | Status: AC | PRN
  Administered 2023-06-30: 100 mL via INTRAVENOUS

## 2023-06-30 NOTE — Discharge Instructions (Addendum)
For the next 2 days take 10 mg of coumadin (2 of your 5mg  tablets) then resume 5mg  daily regimen. Have INR re-checked early next week.  Follow-up with your primary care doctor as well.  I recommend discontinue daytime use of Flexeril.

## 2023-06-30 NOTE — ED Provider Notes (Signed)
3:09 PM Assumed care of patient from off-going team. For more details, please see note from same day.  In brief, this is a 53 y.o. female On coumadin for h/o PEs Has been taking flexeril for L sided chest pain Felt "drunk" and near syncopal this AM 10 mg x 2 days of coumadin, then down to 5 mg daily, recheck INR next week w/ clinic  Plan/Dispo at time of sign-out & ED Course since sign-out: [ ]  PE scan  BP 114/67 (BP Location: Left Arm)   Pulse (!) 107   Temp 98.2 F (36.8 C) (Oral)   Resp 18   Ht 5\' 6"  (1.676 m)   Wt 119.3 kg   LMP  (Approximate)   SpO2 99%   BMI 42.45 kg/m    ED Course:   Clinical Course as of 06/30/23 2005  Wed Jun 30, 2023  1726 CT Angio Chest PE W and/or Wo Contrast 1. Suboptimal contrast opacification of the pulmonary arterial system. No definite acute pulmonary embolus is seen. 2. No CT evidence for acute intrathoracic abnormality. 3. Aortic atherosclerosis.   [HN]    Clinical Course User Index [HN] Loetta Rough, MD   D/w patient who is relieved she has no PE noted on scan. She is made aware of the suboptimal contrast opacification on scan.  She is instructed to stop taking the Flexeril during the daytime and to take Tylenol 1 g every 8 hours, heat and ice packs for her right sided chest wall pain.  Also given instructions for her Coumadin and have INR rechecked.  Patient reports understanding.  Given discharge instructions and return precautions, all questions answered to patient satisfaction.  ------------------------------- Vivi Barrack, MD Emergency Medicine  This note was created using dictation software, which may contain spelling or grammatical errors.   Loetta Rough, MD 06/30/23 2006

## 2023-06-30 NOTE — ED Triage Notes (Signed)
C/O trembling, "my insides are shaking", lightheadedness and pain on right side. Reports been taking flexeril twice a day for 5 days with no relief

## 2023-06-30 NOTE — ED Notes (Signed)
Patient asked to get her INR check as she is on Coumadin for blood clots.

## 2023-06-30 NOTE — ED Provider Notes (Signed)
[facility] Provider Note   CSN: [id number] Arrival date & time: 06/30/23  1204     History  Chief Complaint  Patient presents with   Near Syncope    Adriana Hernandez is a 52 y.o. female.  Patient here after some near syncope symptoms this morning, lightheadedness.  She has been taking Flexeril twice a day in the last few days for some right-sided chest wall/chest pain.  She has history of blood clots on Coumadin.  She denies any weakness numbness tingling but did feel very lightheaded and sort of drunk this morning.  She denies any fever chills or cough or sputum production.  She is feeling much better now.  Does miss some doses of Coumadin at times.  Last checked a few weeks ago.  The history is provided by the patient.       Home Medications Prior to Admission medications   Medication Sig Start Date End Date Taking? Authorizing Provider  albuterol (PROVENTIL HFA;VENTOLIN HFA) 108 (90 Base) MCG/ACT inhaler Inhale into the lungs every 6 (six) hours as needed for wheezing or shortness of breath.    [provider]  allopurinol (ZYLOPRIM) 100 MG tablet Take 100 mg by mouth daily.    [provider]  atorvastatin (LIPITOR) 20 MG tablet TAKE 1 TABLET BY MOUTH EVERY DAY AT NIGHT 06/28/20   [provider]  colchicine 0.6 MG tablet Take 1 tablet by mouth daily. 09/23/20   [provider]  cyanocobalamin (,VITAMIN B-12,) 1000 MCG/ML injection Inject into the muscle. 02/16/20   [provider]  DULoxetine (CYMBALTA) 30 MG capsule Take by mouth. 09/03/20 12/02/20  [provider]  ergocalciferol (VITAMIN D2) 1.25 MG (50000 UT) capsule TAKE 1 CAPSULE BY MOUTH ONE TIME PER WEEK 04/28/17   [provider]  fluticasone-salmeterol (ADVAIR HFA) 230-21 MCG/ACT inhaler Inhale 2 puffs into the lungs 2 (two) times daily.    [provider]  lisinopril-hydrochlorothiazide (PRINZIDE,ZESTORETIC)  20-12.5 MG tablet Take 1 tablet by mouth daily.    [provider]  warfarin (COUMADIN) 5 MG tablet TAKE 1 TABLET DAILY AT 6PM. EXCEPT 1 AND 1/2 TABLET ON  THURSDAYS -- OR AS DIRECTED 10/07/20   [provider]  zolpidem (AMBIEN) 5 MG tablet TAKE 1 TABLET BY MOUTH EVERY DAY AT BEDTIME AS NEEDED FOR SLEEP 09/23/20   [provider]      Allergies    Penicillins    Review of Systems   Review of Systems  Physical Exam Updated Vital Signs BP 114/67 (BP Location: Left Arm)   Pulse (!) 107   Temp 98.2 F (36.8 C) (Oral)   Resp 18   Ht 5\' 6"  (1.676 m)   Wt 119.3 kg   LMP  (Approximate)   SpO2 99%   BMI 42.45 kg/m  Physical Exam Vitals and nursing note reviewed.  Constitutional:      General: She is not in acute distress.    Appearance: She is well-developed. She is not ill-appearing.  HENT:     Head: Normocephalic and atraumatic.     Mouth/Throat:     Mouth: Mucous membranes are moist.  Eyes:     Extraocular Movements: Extraocular movements intact.     Conjunctiva/sclera: Conjunctivae normal.     Pupils: Pupils are equal, round, and reactive to light.  Cardiovascular:     Rate and Rhythm: Normal rate and regular rhythm.     Pulses: Normal pulses.  Heart sounds: Normal heart sounds. No murmur heard. Pulmonary:     Effort: Pulmonary effort is normal. No respiratory distress.     Breath sounds: Normal breath sounds.  Abdominal:     Palpations: Abdomen is soft.     Tenderness: There is no abdominal tenderness.  Musculoskeletal:        General: No swelling.     Cervical back: Normal range of motion and neck supple.  Skin:    General: Skin is warm and dry.     Capillary Refill: Capillary refill takes less than 2 seconds.  Neurological:     General: No focal deficit present.     Mental Status: She is alert and oriented to person, place, and time.     Cranial Nerves: No cranial nerve deficit.     Sensory: No sensory deficit.     Motor: No  weakness.     Comments: 5+ out of 5 strength throughout, normal sensation, no drift, normal finger-to-nose finger, normal speech  Psychiatric:        Mood and Affect: Mood normal.     ED Results / Procedures / Treatments   Labs (all labs ordered are listed, but only abnormal results are displayed) Labs Reviewed  BASIC METABOLIC PANEL - Abnormal; Notable for the following components:      Result Value   Sodium 134 (*)    Calcium 8.7 (*)    All other components within normal limits  CBC - Abnormal; Notable for the following components:   RBC 3.39 (*)    MCV 110.6 (*)    MCH 39.5 (*)    All other components within normal limits  PROTIME-INR  TROPONIN I (HIGH SENSITIVITY)    EKG EKG Interpretation Date/Time:  Wednesday June 30 2023 12:21:32 EDT Ventricular Rate:  108 PR Interval:  143 QRS Duration:  93 QT Interval:  330 QTC Calculation: 443 R Axis:   55  Text Interpretation: Sinus tachycardia Probable left atrial enlargement Low voltage, precordial leads Borderline repolarization abnormality Confirmed by Virgina Norfolk [phone]) on 06/30/2023 12:31:44 PM  Radiology No results found.  Procedures Procedures    Medications Ordered in ED Medications - No data to display  ED Course/ Medical Decision Making/ A&P                                 Medical Decision Making Amount and/or Complexity of Data Reviewed Labs: ordered. Radiology: ordered.   Adriana Hernandez is here with chest pain and near syncope.  Normal vitals.  No fever.  Sinus rhythm on EKG.  No ischemic changes.  History of blood clots on Coumadin.  Patient has been taking Flexeril twice a day for the last few days for some right-sided chest discomfort.  She took Flexeril this morning.  She felt little bit lightheaded and dizzy and sort of drunk at work today.  Overall differential diagnosis likely side effect of her Flexeril but will evaluate for ACS, check her INR, check labs for anemia, electrolyte  abnormality, dehydration.  Will get a chest x-ray.  Per my review and interpretation of labs her INR subtherapeutic at 1.1.  On January 3 it was 1.6.  Overall we will get a PET scan to further evaluate for PE given that she has been subtherapeutic having some chest discomfort.  However troponin is normal per my review interpretation the labs and I have no concern for ACS.  Lab work otherwise unremarkable  with no significant anemia or electrolyte abdomen kidney injury or leukocytosis.  Overall I have talked with pharmacy.  If patient does not have any new blood clots on CT scan they recommend having her take 10 mg of Coumadin for the next 2 days and then resume her 5 mg daily regimen and follow-up with her INR clinic for further adjustments.  Overall patient handed off to oncoming ED staff with patient pending CT scan of her chest.  Please see the note for further results, evaluation, disposition of the patient.  This chart was dictated using voice recognition software.  Despite best efforts to proofread,  errors can occur which can change the documentation meaning.         Final Clinical Impression(s) / ED Diagnoses Final diagnoses:  Near syncope    Rx / DC Orders ED Discharge Orders     None         Virgina Norfolk, DO 06/30/23 1431
